# Patient Record
Sex: Female | Born: 1970 | Race: White | Hispanic: No | Marital: Married | Smoking: Never smoker
Health system: Southern US, Community
[De-identification: ages and names within clinical notes are randomized; demographics above are authoritative.]

## PROBLEM LIST (undated history)

## (undated) DIAGNOSIS — F419 Anxiety disorder, unspecified: Secondary | ICD-10-CM

## (undated) DIAGNOSIS — Z9289 Personal history of other medical treatment: Secondary | ICD-10-CM

## (undated) DIAGNOSIS — I493 Ventricular premature depolarization: Secondary | ICD-10-CM

## (undated) HISTORY — PX: DILATION AND EVACUATION: SHX1459

## (undated) HISTORY — DX: Anxiety disorder, unspecified: F41.9

## (undated) HISTORY — DX: Personal history of other medical treatment: Z92.89

## (undated) HISTORY — DX: Ventricular premature depolarization: I49.3

---

## 2003-02-06 ENCOUNTER — Encounter: Payer: Self-pay | Admitting: Obstetrics and Gynecology

## 2003-02-06 ENCOUNTER — Ambulatory Visit (HOSPITAL_COMMUNITY): Admission: RE | Admit: 2003-02-06 | Discharge: 2003-02-06 | Payer: Self-pay | Admitting: Obstetrics and Gynecology

## 2003-02-27 ENCOUNTER — Encounter: Payer: Self-pay | Admitting: Obstetrics and Gynecology

## 2003-02-27 ENCOUNTER — Ambulatory Visit (HOSPITAL_COMMUNITY): Admission: RE | Admit: 2003-02-27 | Discharge: 2003-02-27 | Payer: Self-pay | Admitting: Obstetrics and Gynecology

## 2003-06-03 ENCOUNTER — Inpatient Hospital Stay (HOSPITAL_COMMUNITY): Admission: AD | Admit: 2003-06-03 | Discharge: 2003-06-03 | Payer: Self-pay | Admitting: Obstetrics and Gynecology

## 2003-07-04 ENCOUNTER — Inpatient Hospital Stay (HOSPITAL_COMMUNITY): Admission: AD | Admit: 2003-07-04 | Discharge: 2003-07-08 | Payer: Self-pay | Admitting: Internal Medicine

## 2003-08-13 ENCOUNTER — Other Ambulatory Visit: Admission: RE | Admit: 2003-08-13 | Discharge: 2003-08-13 | Payer: Self-pay | Admitting: Obstetrics and Gynecology

## 2003-09-22 ENCOUNTER — Encounter: Admission: RE | Admit: 2003-09-22 | Discharge: 2003-10-22 | Payer: Self-pay | Admitting: Obstetrics and Gynecology

## 2004-04-19 ENCOUNTER — Encounter: Admission: RE | Admit: 2004-04-19 | Discharge: 2004-04-19 | Payer: Self-pay | Admitting: Obstetrics and Gynecology

## 2005-10-20 ENCOUNTER — Encounter (INDEPENDENT_AMBULATORY_CARE_PROVIDER_SITE_OTHER): Payer: Self-pay | Admitting: Specialist

## 2005-10-20 ENCOUNTER — Ambulatory Visit (HOSPITAL_COMMUNITY): Admission: RE | Admit: 2005-10-20 | Discharge: 2005-10-20 | Payer: Self-pay | Admitting: Obstetrics and Gynecology

## 2006-06-07 ENCOUNTER — Inpatient Hospital Stay (HOSPITAL_COMMUNITY): Admission: AD | Admit: 2006-06-07 | Discharge: 2006-06-07 | Payer: Self-pay | Admitting: Obstetrics and Gynecology

## 2006-06-19 ENCOUNTER — Ambulatory Visit (HOSPITAL_COMMUNITY): Admission: RE | Admit: 2006-06-19 | Discharge: 2006-06-19 | Payer: Self-pay | Admitting: Obstetrics and Gynecology

## 2006-07-10 ENCOUNTER — Ambulatory Visit (HOSPITAL_COMMUNITY): Admission: RE | Admit: 2006-07-10 | Discharge: 2006-07-10 | Payer: Self-pay | Admitting: Obstetrics and Gynecology

## 2006-08-09 ENCOUNTER — Ambulatory Visit (HOSPITAL_COMMUNITY): Admission: RE | Admit: 2006-08-09 | Discharge: 2006-08-09 | Payer: Self-pay | Admitting: Obstetrics and Gynecology

## 2006-11-06 ENCOUNTER — Inpatient Hospital Stay (HOSPITAL_COMMUNITY): Admission: AD | Admit: 2006-11-06 | Discharge: 2006-11-06 | Payer: Self-pay | Admitting: Obstetrics and Gynecology

## 2006-12-02 ENCOUNTER — Inpatient Hospital Stay (HOSPITAL_COMMUNITY): Admission: AD | Admit: 2006-12-02 | Discharge: 2006-12-05 | Payer: Self-pay | Admitting: Obstetrics and Gynecology

## 2008-07-03 IMAGING — US US OB DETAIL+14 WK
1 series · 14 of 28 positions shown · non-contrast
Comparison: none

OBSTETRICAL ULTRASOUND:
 This ultrasound was performed in The [HOSPITAL], and the [facility] report will be stored to [REDACTED] PACS.

[Series 1: us ob detail+14 wk · 14 of 87 slices shown]
[im 4/87]
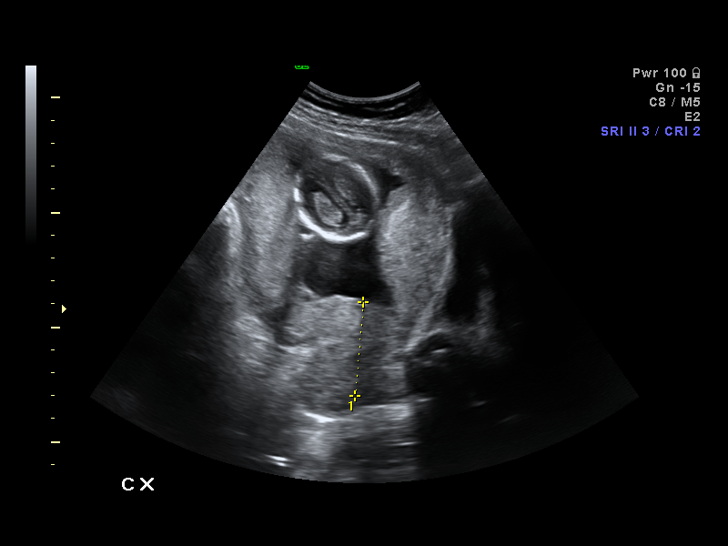
[im 10/87]
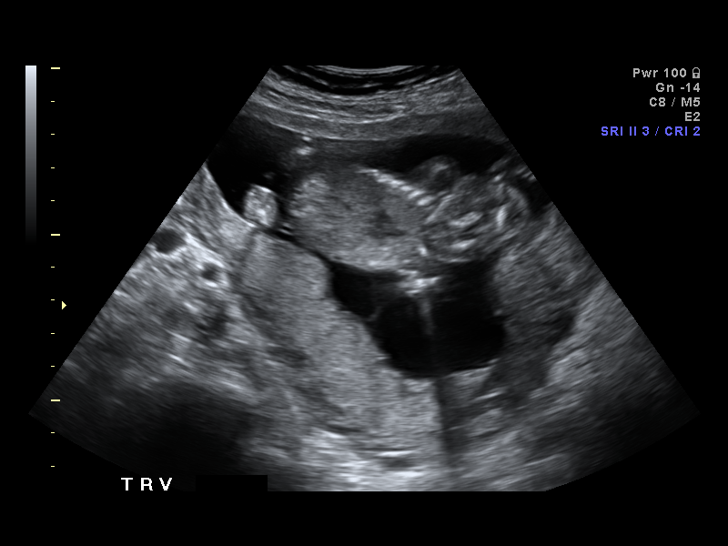
[im 16/87]
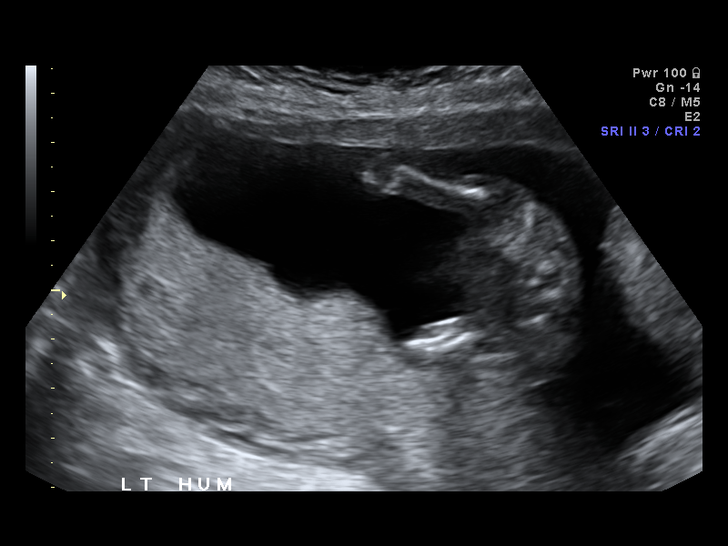
[im 23/87]
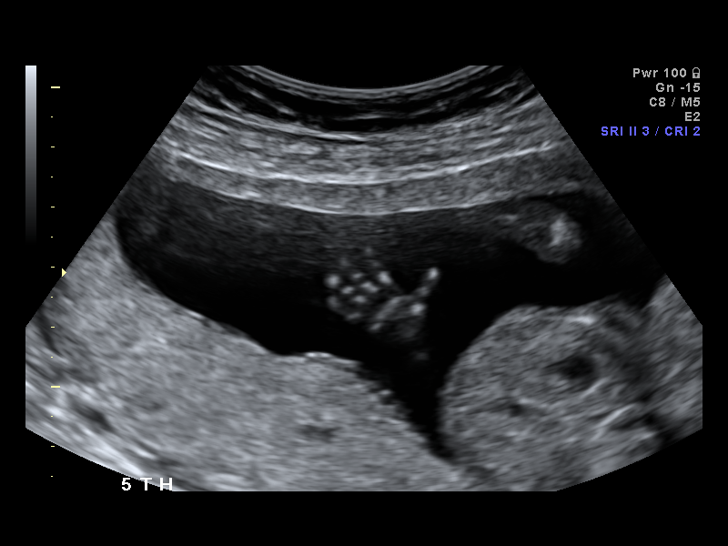
[im 29/87]
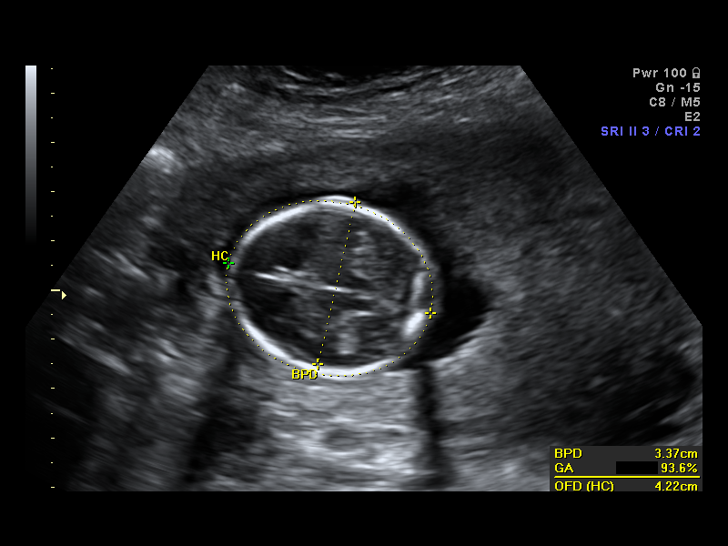
[im 36/87]
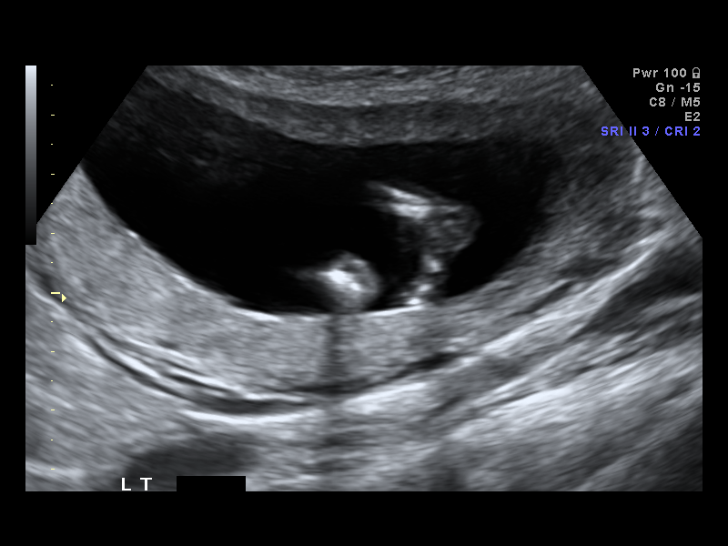
[im 42/87]
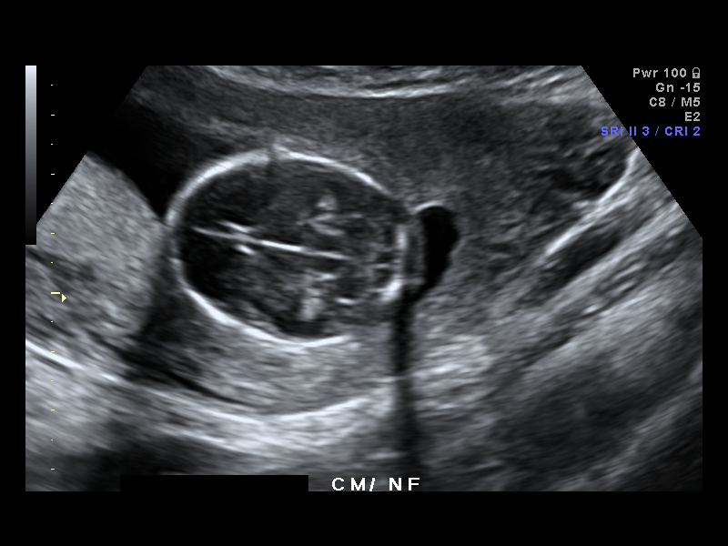
[im 48/87]
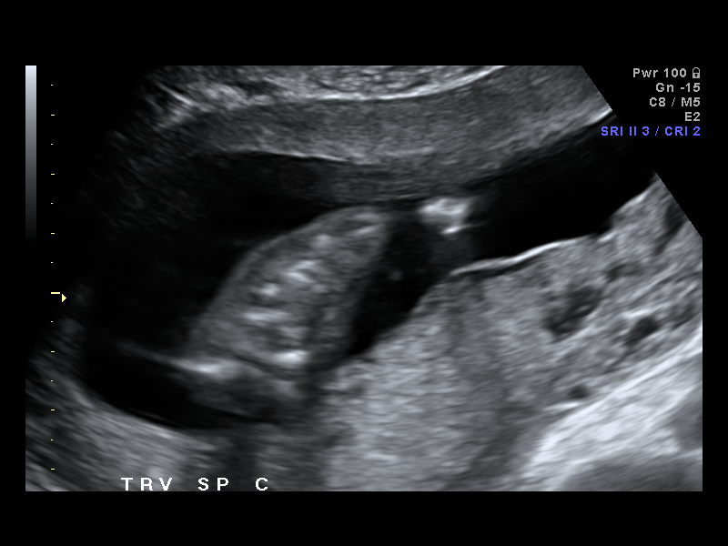
[im 55/87]
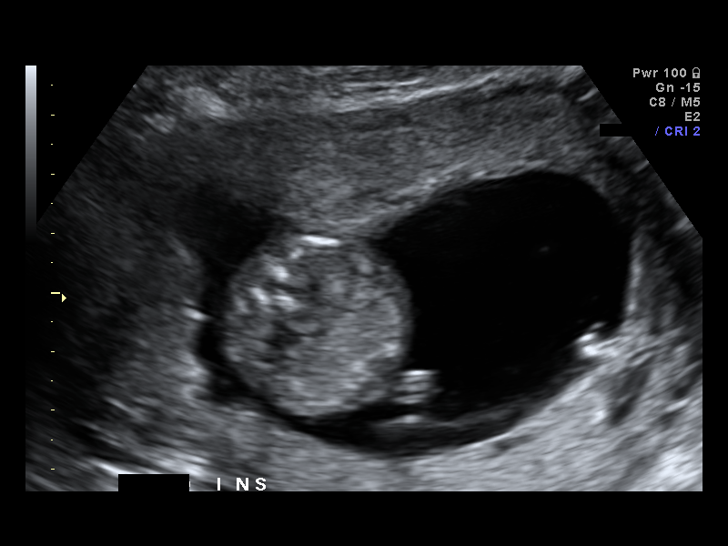
[im 61/87]
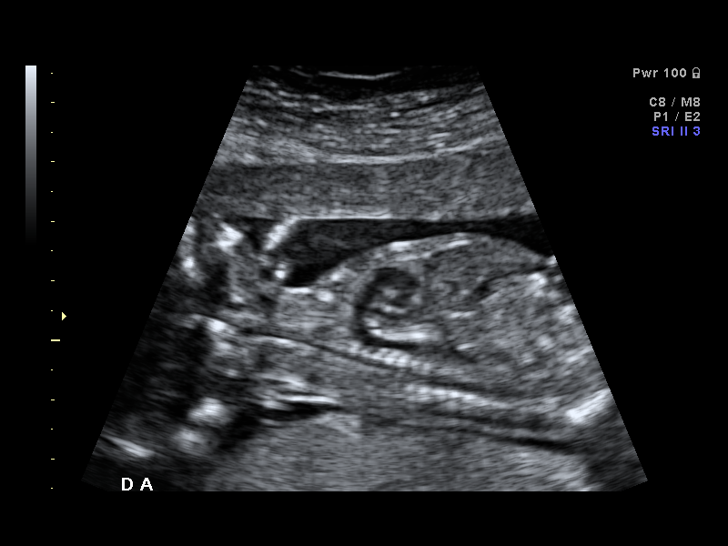
[im 67/87]
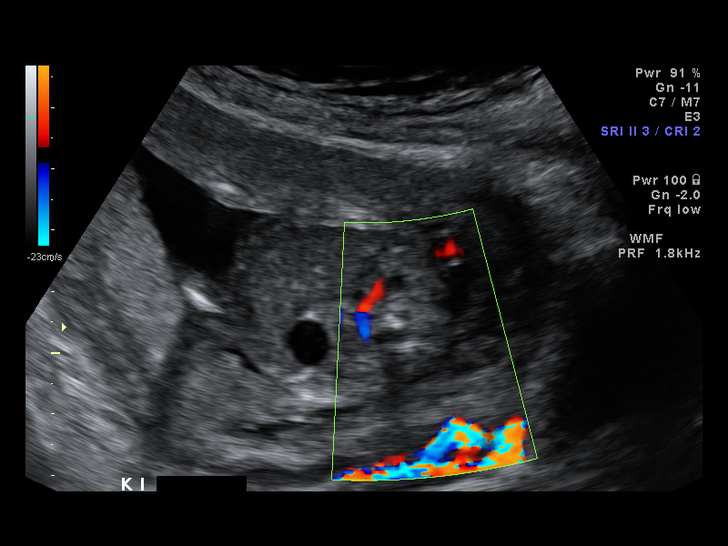
[im 74/87]
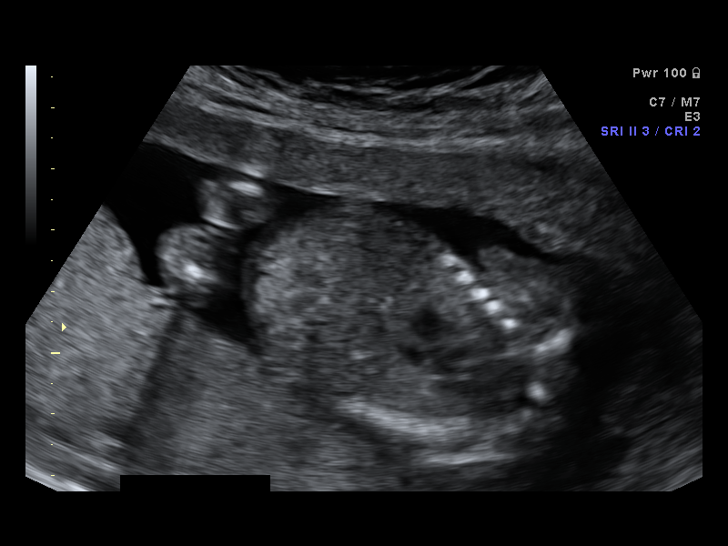
[im 80/87]
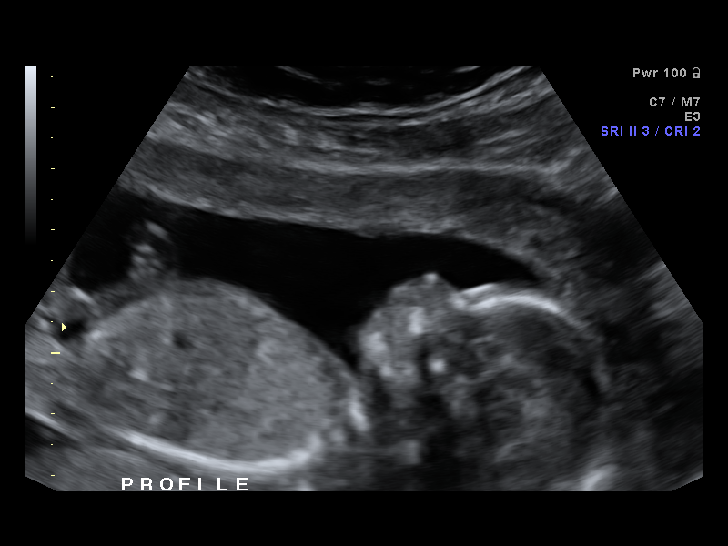
[im 87/87]
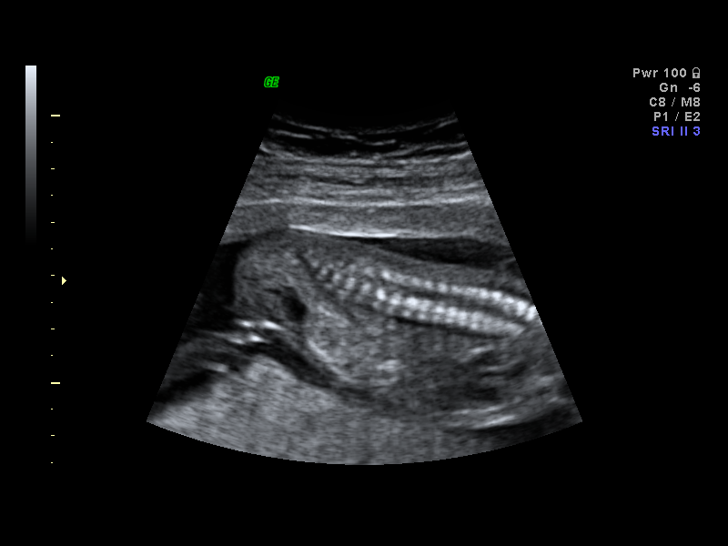

[14 of 28 positions shown; findings below may reference images not displayed]

IMPRESSION: The [facility] report has also been faxed to the ordering physician.

## 2008-10-13 ENCOUNTER — Encounter: Admission: RE | Admit: 2008-10-13 | Discharge: 2008-10-13 | Payer: Self-pay | Admitting: Sports Medicine

## 2010-11-15 NOTE — Op Note (Signed)
NAMEWOODROW, DULSKI           ACCOUNT NO.:  [id number]   MEDICAL RECORD NO.:  [id number]          PATIENT TYPE:  INP   LOCATION:  9198                          FACILITY:  [facility]   PHYSICIAN:  Duke Salvia. Marcelle Overlie, M.D.DATE OF BIRTH:  11/09/1970   DATE OF PROCEDURE:  12/02/2006  DATE OF DISCHARGE:                               OPERATIVE REPORT   PREOPERATIVE DIAGNOSES:  1. Term intrauterine pregnancy in labor.  2. Previous cesarean section x2, declines vaginal birth after cesarean      section.   POSTOPERATIVE DIAGNOSES:  1. Term intrauterine pregnancy in labor.  2. Previous cesarean section x2, declines vaginal birth after cesarean      section.   PROCEDURE:  Repeat low transverse cesarean section.   SURGEON:  Duke Salvia. Marcelle Overlie, M.D.   ANESTHESIA:  Spinal.   COMPLICATIONS:  None.   DRAINS:  Foley catheter.   BLOOD LOSS:  800 mL.   PROCEDURE AND FINDINGS:  The patient was taken to the operating room.  After an adequate level of spinal anesthetic was obtained with the  patient in left tilt position, the abdomen prepped and draped in the  usual manner for sterile abdominal procedure.  Foley catheter positioned  draining clear urine.  Transverse incision was made excising the old  scar.  This was carried down to the fascia which was incised and  extended transversely.  Rectus muscle divided in the midline.  Peritoneum entered superiorly without incident and extended vertically.  The vesicouterine serosa was then incised and the bladder was sharply  and bluntly dissected below.  Bladder blade repositioned.  Transverse  incision made in the lower segment which was quite thin, this was  extended with blunt dissection.  Clear fluid noted.  The patient  delivered of an 9 pound 3 ounce female. Apgars 9 and 9.  The infant was  suctioned, cord clamped and passed to pediatric team for further care.  The placenta was then delivered manually intact.  Uterus exteriorized,  cavity wiped  clean with laparotomy pack.  Closure obtained with first  layer of 0 chromic in a locked fashion followed by an imbricating layer  with chromic.  The bladder flap area was intact and hemostatic.  Tubes  and ovaries were normal.  Prior to closure sponge, needle and instrument  counts were reported as correct x2.  Peritoneum closed with running 2-0  Vicryl suture.  A 2-0 Vicryl interrupted was used to approximate the  rectus muscles in the midline, 0 PDS was then used to close the fascia  from laterally to midline on either side.  Subcutaneous tissue was hemostatic.  Clips and Steri-Strips were used on  the skin.  Pressure dressing applied.  Clear urine noted at the end of  the case.  She did receive Ancef 1 gram IV after the cord was clamped  along with Pitocin IV.  She tolerated this well.  Mother and baby doing  well at that point.      Richard M. Marcelle Overlie, M.D.  Electronically Signed     RMH/MEDQ  D:  12/02/2006  T:  12/02/2006  Job:  [id number]

## 2010-11-18 NOTE — Op Note (Signed)
NAME:  [name], [name]                     ACCOUNT NO.:  [id number]   MEDICAL RECORD NO.:  [id number]                   PATIENT TYPE:  INP   LOCATION:  9146                                 FACILITY:  [facility]   PHYSICIAN:  Zenaida Niece, M.D.             DATE OF BIRTH:  01/21/1971   DATE OF PROCEDURE:  07/04/2003  DATE OF DISCHARGE:                                 OPERATIVE REPORT   PREOPERATIVE DIAGNOSES:  1. Intrauterine pregnancy at 39 weeks.  2. Meconium stained amniotic fluid.  3. Arrest of dilation.   POSTOPERATIVE DIAGNOSES:  1. Intrauterine pregnancy at 39 weeks.  2. Meconium stained amniotic fluid.  3. Arrest of dilation.   PROCEDURE:  Primary low transverse cesarean section without extensions.   SURGEON:  Zenaida Niece, M.D.   ANESTHESIA:  Epidural.   ESTIMATED BLOOD LOSS:  1000 mL.   FINDINGS:  The patient had normal anatomy and delivered a viable female infant  with Apgars of 9 and 9 that weighed 9 pounds, 1 ounce.   PROCEDURE IN DETAIL:  The patient was taken to the operating room and placed  in the dorsal supine position.  Previously placed epidural was dosed  appropriately and abdomen was prepped and draped in the usual sterile  fashion.  The level of anesthesia was found to be adequate, and abdomen was  entered via a standard Pfannenstiel incision.  Bladder blade was placed, and  a transverse incision was made in the lower uterine segment and the bladder  pushed inferior.  The uterine incision was extended bilaterally digitally.  The fetal vertex was grasped and delivered through the incision  atraumatically.  Suction was performed with DeLee suction as well as bulb,  and a small amount of lightly meconium stained fluid was aspirated.  The  remainder of the infant delivered atraumatically.  The baby cried while it  was still delivered only to the head.  The cord was doubly clamped and cut  and the infant handed to the awaiting pediatric team.  Cord  blood and  segment of cord for gas were obtained and the placenta delivered  spontaneously.  Uterus was wiped dry with a clean lap pad, and mild uterine  atony controlled with IV Pitocin and massage.  The uterine incision was  inspected and found to be free of extensions.  Incision was closed in one  layer, being a running locking layer with #1 chromic with adequate  hemostasis.  Both tubes and ovaries were inspected and found to be normal.  The uterine incision was again inspected and found to be hemostatic.  Subfascial space was irrigated and made hemostatic with electrocautery.  The  fascia was closed in a running fashion, starting at both ends and meeting in  the middle with 0 Vicryl.  The subcutaneous tissue was irrigated and made  hemostatic with  electrocautery.  Skin was closed with running subcuticular suture of 4-0  Prolene followed by  Steri-Strips and a pressure bandage.  The patient  tolerated the procedure well and was taken to the recovery room in stable  condition.  Counts were correct x 2, and she was given Ancef 1 g after cord  clamped.                                               Zenaida Niece, M.D.    TDM/MEDQ  D:  07/04/2003  T:  07/05/2003  Job:  [id number]

## 2010-11-18 NOTE — Op Note (Signed)
NAMEMarland Martin  [name], [name] NO.:  [id number]   MEDICAL RECORD NO.:  [id number]          PATIENT TYPE:  AMB   LOCATION:  SDC                           FACILITY:  [facility]   PHYSICIAN:  Juluis Mire, M.D.   DATE OF BIRTH:  January 27, 1971   DATE OF PROCEDURE:  10/20/2005  DATE OF DISCHARGE:  10/20/2005                                 OPERATIVE REPORT   PREOPERATIVE DIAGNOSIS:  Nonviable first trimester pregnancy.   POSTOPERATIVE DIAGNOSIS:  Nonviable first trimester pregnancy.   OPERATIVE PROCEDURE:  Dilatation and evacuation.   SURGEON:  Juluis Mire, M.D.   ANESTHESIA:  Paracervical block and sedation.   ESTIMATED BLOOD LOSS:  Minimal.   PACK AND DRAINS:  None.   INTRAOPERATIVE BLOOD REPLACEMENT:  None.   COMPLICATIONS:  None.   INDICATION:  Indications are dictated in the history and physical.   DESCRIPTION OF PROCEDURE:  The patient was taken to the OR and placed in a  supine position.  After sedation, she was placed in the dorsal lithotomy  position using the Allen stirrups.  The patient was draped into a sterile  field.  A speculum was placed in the vaginal vault and cervix and vagina  cleansed with Betadine.  Paracervical block was instituted using 1%  Nesacaine.  Cervix was secured with a single-tooth tenaculum.  Uterus  sounded to approximately 12 cm.  Cervix was serially dilated to a size 27  Pratt dilator.  A size 8 suction curette was introduced and the intrauterine  cavity was evacuated using suction curetting.  We continued this until no  additional tissue was obtained.  We then sharply curetted the intrauterine  cavity and we felt like all quadrants were clear and the uterus was  contracting down well.  Repeat suction curettage revealed no additional  tissue.  We had minimal bleeding and no signs of complications.  The single-  tooth tenaculum and speculum were then removed.  The patient was taken out  of the dorsal lithotomy position and once alert,  transferred to recovery  room in good condition.  Sponge, needle and instrument count was reported as  correct by circulating nurse x2.      Juluis Mire, M.D.  Electronically Signed    JSM/MEDQ  D:  10/20/2005  T:  10/23/2005  Job:  [id number]

## 2010-11-18 NOTE — Discharge Summary (Signed)
[name], [name]                     ACCOUNT NO.:  [id number]   MEDICAL RECORD NO.:  [id number]                   PATIENT TYPE:  INP   LOCATION:  9146                                 [facility]   PHYSICIAN:  Zenaida Niece, M.D.             DATE OF BIRTH:  July 06, 1970   DATE OF ADMISSION:  07/04/2003  DATE OF DISCHARGE:  07/08/2003                                 DISCHARGE SUMMARY   ADMISSION DIAGNOSES:  1. Intrauterine pregnancy at 39 weeks.  2. Meconium stained amniotic fluid.   DISCHARGE DIAGNOSES:  1. Intrauterine pregnancy at 39 weeks.  2. Meconium stained amniotic fluid.  3. Arrest of dilation.   PROCEDURE:  On January 1 she had a primary low transverse cesarean section.   HISTORY AND PHYSICAL:  This is a 40 year old white female gravida 1, para 0  with an EGA of [redacted] weeks who presented with the complaint of ruptured  membranes and regular contractions.  Prenatal care uncomplicated for a  fundal height slightly ahead of dates.   PRENATAL LABORATORIES:  Blood type is A+ with a negative antibody screen.  RPR nonreactive.  Rubella immune.  Hepatitis B surface antigen negative.  HIV negative.  Gonorrhea and Chlamydia negative.  Triple screen normal.  One-  hour Glucola 106.  Group B Strep is negative.   PAST MEDICAL HISTORY:  Noncontributory.   PHYSICAL EXAMINATION:  VITAL SIGNS:  She is afebrile with stable vital  signs.  ABDOMEN:  Soft.  Fundus is nontender.  Fetal heart tracing is normal with  contractions every two to three minutes.  PELVIC:  Cervix on Dr. Ebony Martin first examination is 4, 90, -1, and a vertex  presentation and there was slightly meconium stained amniotic fluid.   HOSPITAL COURSE:  The patient was admitted after she presented with  contractions and leaking fluid and was confirmed to have ruptured membranes  with slightly stained meconium fluid.  She continued to progress and  received an epidural.  She then had a protracted course and  was started on  Pitocin.  With Pitocin she continued to have a protracted course and was not  able to make it past 8-9 cm.  This was after several hours of Pitocin with  fairly adequate contractions by an IUPC.  On the evening of January 1 she  underwent a primary low transverse cesarean section for arrest of dilation.  This was done epidural anesthesia with an estimated blood loss of 1000 mL.  The patient had normal anatomy and delivered a viable female infant with  Apgars of 9 and 9 that weighed 9 pounds 1 ounce.  Postoperatively she had no  complications.  Pre delivery hemoglobin 12.9, post delivery 10.5.  She did  stay a full four days postoperatively to work on breast-feeding.  On  postoperative day #4 she was stable for discharge home.  She had a Prolene  subcuticular suture which was removed on the evening of  postoperative day #3  and Steri-Strips were in place.   DISCHARGE INSTRUCTIONS:  Regular diet.  Pelvic rest.  No strenuous activity.  Follow-up is in two weeks for an incision check.  Medications are Percocet  p.r.n. pain and over-the-counter ibuprofen p.r.n. pain.  She is given our  discharge pamphlet.                                               Zenaida Niece, M.D.    TDM/MEDQ  D:  07/08/2003  T:  07/08/2003  Job:  [id number]

## 2010-11-18 NOTE — H&P (Signed)
NAME:  [name], [name] NO.:  [id number]   MEDICAL RECORD NO.:  [id number]          PATIENT TYPE:  AMB   LOCATION:  SDC                           [facility]ILITY:  [facility]   PHYSICIAN:  Juluis Mire, M.D.   DATE OF BIRTH:  1970-12-26   DATE OF ADMISSION:  10/20/2005  DATE OF DISCHARGE:                                HISTORY & PHYSICAL   HISTORY OF PRESENT ILLNESS:  The patient is a 40 year old gravida 2, para 1,  married white female, last menstrual period of August 12, 2005, giving her  an estimated date of confinement of May 20, 2006 and gives her an  estimated gestational age of approximately 10 weeks.  She was having  bleeding and came into the office for an ultrasound.  Ultrasound revealed a  6-week 3-day-size gestational sac.  Fetal pole was noted with no fetal heart  activity, consistent with a nonviable first trimester of pregnancy.  The  patient now presents for dilatation and evacuation.   ALLERGIES:  No known drug allergies.   MEDICATIONS:  Vitamins.   PAST MEDICAL HISTORY:  Usual childhood diseases with no significant  sequelae.   PAST SURGICAL HISTORY:  She had a prior cesarean section.   FAMILY HISTORY:  Noncontributory.   SOCIAL HISTORY:  No tobacco or alcohol use.   REVIEW OF SYSTEMS:  Noncontributory.   PHYSICAL EXAMINATION:  VITAL SIGNS:  The patient is afebrile with stable  vital signs.  HEENT:  The patient is normocephalic.  Pupils are equal, round and reactive  to light and accommodation.  Extraocular movements were intact.  Sclerae and  conjunctivae were clear.  Oropharynx clear.  NECK:  Without thyromegaly.  BREASTS:  No discrete masses.  LUNGS:  Clear.  CARDIOVASCULAR:  Regular rate and rhythm without murmurs or gallops.  ABDOMEN:  Benign.  No mass, organomegaly or tenderness.  PELVIC:  Uterus 8-9 weeks in size.  Adnexa unremarkable.  EXTREMITIES:  Trace edema.  NEUROLOGIC:  Grossly within normal limits.   IMPRESSION:  Nonviable  first trimester of pregnancy.   PLAN:  The patient will undergo dilatation and evacuation.  Blood type will  be determined to see if RhoGAM is needed.  Risks of the procedure were  discussed including the risk of infection, the risk of hemorrhage that could  require transfusion with the risks of AIDS or hepatitis, the risk of injury  to adjacent organs including bladder, bowel or ureters which could require  further exploratory surgery, risks of deep venous thrombosis and pulmonary  embolus.  The patient expressed understanding of indications and risks.      Juluis Mire, M.D.  Electronically Signed     JSM/MEDQ  D:  10/20/2005  T:  10/20/2005  Job:  [id number]

## 2010-11-18 NOTE — Discharge Summary (Signed)
[name], [name]           ACCOUNT NO.:  [id number]   MEDICAL RECORD NO.:  [id number]          PATIENT TYPE:  INP   LOCATION:  9142                          [facility]ACILITY:  [facility]   PHYSICIAN:  Dineen Kid. Rana Snare, M.D.    DATE OF BIRTH:  03-21-1971   DATE OF ADMISSION:  12/02/2006  DATE OF DISCHARGE:  12/05/2006                               DISCHARGE SUMMARY   ADMITTING DIAGNOSES:  1. Intrauterine pregnancy at term.  2. Previous cesarean section desires repeat cesarean delivery.  3. Spontaneous onset of labor.   DISCHARGE DIAGNOSES:  1. Status post low transverse cesarean section.  2. Viable female infant.   PROCEDURE:  Repeat low transverse cesarean section.   REASON FOR ADMISSION:  Please see written H&P.   HOSPITAL COURSE:  The patient is a 40 year old gravida 3, para 1-0-1-1  that was admitted to [facility] with spontaneous onset  of labor.  The patient had had two previous cesarean deliveries and  desired a repeat cesarean section.  On admission, vital signs were  stable, blood pressure 120/78.  Cervix was dilated to 7 cm, completely  effaced, vertex at a -2 station.  Fetal heart tones were reactive with a  rate of 142 beats per minute.  The patient was offered a vaginal birth  after cesarean; however, she declined.  The patient was then transferred  to the operating room where spinal anesthesia was administered without  difficulty.  A low transverse incision was made with the delivery of a  viable female infant weighing 9 pounds 3 ounces with Apgars of 9 at one  minute and 9 at five minutes.  The patient tolerated the procedure well  and was taken to the recovery room in stable condition.  On  postoperative day #1 the patient was without complaint.  Vital signs  were stable.  She was afebrile.  Abdomen was soft with good return of  bowel function.  Fundus was firm and nontender.  Abdominal dressing was  noted to be clean, dry and intact.  Laboratory findings  showed  hemoglobin of 10.1; platelet count of 259,000; wbc count of 13.3.  On  postoperative day #2 the patient did have some small oozing that was  noted from the midpoint of the incision.  Vital signs were stable.  She  was afebrile.  Abdomen was soft, fundus firm and nontender.  She was  ambulating well, tolerating a regular diet without complaints of nausea  and vomiting.  On postoperative day #3 the patient was without  complaint.  Vital signs were stable.  Abdomen was soft, fundus firm and  nontender.  Incision was clean, dry and intact.  Staples were removed  and the patient was later discharged home.   CONDITION ON DISCHARGE:  Good.   DIET:  Regular as tolerated.   ACTIVITY:  No heavy lifting, no driving x2 weeks, no vaginal entry.   FOLLOWUP:  The patient is to follow up in the office in 1-2 weeks for an  incision check.  She is to call for temperature greater than 100  degrees, persistent nausea and vomiting, heavy vaginal bleeding,  and/or  redness or drainage from the incisional site.   DISCHARGE MEDICATIONS:  1. Percocet 5/325 #30 one p.o. every 4-6 hours p.r.n.  2. Motrin 600 mg every 6 hours.  3. Prenatal vitamins one p.o. daily.  4. Colace one p.o. daily p.r.n.      Julio Sicks, N.P.      Dineen Kid Rana Snare, M.D.  Electronically Signed    CC/MEDQ  D:  01/07/2007  T:  01/07/2007  Job:  [id number]

## 2011-03-17 ENCOUNTER — Other Ambulatory Visit: Payer: Self-pay | Admitting: Gastroenterology

## 2011-04-20 LAB — TYPE AND SCREEN
ABO/RH(D): A POS
Antibody Identification: NOT DETECTED
Antibody Screen: POSITIVE
DAT, IgG: NEGATIVE

## 2011-04-20 LAB — RPR: RPR Ser Ql: NONREACTIVE

## 2011-04-20 LAB — CBC
HCT: 29.9 — ABNORMAL LOW
HCT: 37.5
Hemoglobin: 10.1 — ABNORMAL LOW
Hemoglobin: 12.5
MCHC: 33.4
MCHC: 33.9
MCV: 89.1
MCV: 89.5
Platelets: 259
Platelets: 291
RBC: 3.34 — ABNORMAL LOW
RBC: 4.21
RDW: 13.9
RDW: 14.2 — ABNORMAL HIGH
WBC: 13.3 — ABNORMAL HIGH
WBC: 15.1 — ABNORMAL HIGH

## 2012-03-22 ENCOUNTER — Encounter: Payer: Self-pay | Admitting: *Deleted

## 2012-03-25 ENCOUNTER — Ambulatory Visit (INDEPENDENT_AMBULATORY_CARE_PROVIDER_SITE_OTHER): Payer: 59 | Admitting: Cardiology

## 2012-03-25 ENCOUNTER — Encounter: Payer: Self-pay | Admitting: Cardiology

## 2012-03-25 VITALS — BP 120/78 | HR 71 | Ht 65.0 in | Wt 159.0 lb

## 2012-03-25 DIAGNOSIS — I4949 Other premature depolarization: Secondary | ICD-10-CM

## 2012-03-25 DIAGNOSIS — I493 Ventricular premature depolarization: Secondary | ICD-10-CM

## 2012-03-25 NOTE — Patient Instructions (Addendum)
Your physician recommends that you schedule a follow-up appointment in: We will call you with results  Your physician has requested that you have an echocardiogram. Echocardiography is a painless test that uses sound waves to create images of your heart. It provides your doctor with information about the size and shape of your heart and how well your heart's chambers and valves are working. This procedure takes approximately one hour. There are no restrictions for this procedure.

## 2012-03-25 NOTE — Progress Notes (Signed)
   Clinical Summary [name] is a 41 y.o.female referred for cardiology consultation by Dr. Richardean Martin. She reports a history of palpitations and rapid heart rate, occurs intermittently on a daily basis. No associated syncope. She was diagnosed with symptomatic PVCs approximately 3-4 years ago, was seen by a cardiologist in [place] (details not clear), and underwent reported stress test and echocardiogram. She was not treated with cardiac medications at that time. She does indicate that she was treated ultimately for anxiety and panic attacks, placed on Celexa, and the PVCs resolved completely. She states that she took herself off Celexa months ago and has been experiencing PVCs since then.  ECG shows normal sinus rhythm, vertical axis. Occasional PVCs noted on associated rhythm strip.  She reports no pulmonary symptoms, HTN, thyroid disease, tobacco use, or significant stimulant use. Indicates stable amount of stress in her life recently.   No Known Allergies  Current Outpatient Prescriptions  Medication Sig Dispense Refill  . desogestrel-ethinyl estradiol (APRI,EMOQUETTE,SOLIA) 0.15-30 MG-MCG tablet Take 1 tablet by mouth daily.        Past Medical History  Diagnosis Date  . PVC's (premature ventricular contractions)   . Anxiety     Possible panic attacks - was on Celexa for three years  . History of cardiovascular stress test     Three to four years ago    Past Surgical History  Procedure Date  . Cesarean section     X 2  . Dilation and evacuation     Family History  Problem Relation Age of Onset  . Cancer Maternal Aunt   . Diabetes Paternal Uncle   . Stroke Paternal Aunt     Social History [name] reports that she has never smoked. She does not have any smokeless tobacco history on file. [name] reports that she does not drink alcohol.  Review of Systems Trace edema in ankles at the end of the day. Otherwise negative except as outlined.  Physical  Examination Filed Vitals:   03/25/12 0838  BP: 120/78  Pulse: 71   Filed Weights   03/25/12 0838  Weight: 159 lb (72.122 kg)   Patient in no acute distress. HEENT: Conjunctiva and lids normal, oropharynx clear with moist mucosa. Neck: Supple, no elevated JVP or carotid bruits, no thyromegaly. Lungs: Clear to auscultation, nonlabored breathing at rest. Cardiac: Regular rate and rhythm with occasional ectopic beats, no S3 or significant systolic murmur, no pericardial rub. Abdomen: Soft, nontender, bowel sounds present, no guarding or rebound. Extremities: No pitting edema, distal pulses 2+. Skin: Warm and dry. Musculoskeletal: No kyphosis. Neuropsychiatric: Alert and oriented x3, affect grossly appropriate.   Problem List and Plan   Symptomatic PVCs Previous history based on report. Do not have details of previous cardiac workup, but she states that she was told the testing looked good. Baseline ECG reviewed today. Suspect that her PVCs are benign, without syncope or exercise intolerance, and if LV function remains normal. Followup echocardiogram will be obtained. We discussed basics of exercise, stress reduction, also avoiding stimulant use. I also asked her to followup with her primary care provider to discuss whether she needs to reconsider Celexa or other management for anxiety/panic attacks, particularly since she states that her PVCs completely resolved on prior treatment. At this time would not recommend beta blockers or anitarrhythmic treatment of her PVCs. Will inform her of the echocardiogram results.    [name], M.D., F.A.C.C.

## 2012-03-25 NOTE — Assessment & Plan Note (Signed)
Previous history based on report. Do not have details of previous cardiac workup, but she states that she was told the testing looked good. Baseline ECG reviewed today. Suspect that her PVCs are benign, without syncope or exercise intolerance, and if LV function remains normal. Followup echocardiogram will be obtained. We discussed basics of exercise, stress reduction, also avoiding stimulant use. I also asked her to followup with her primary care provider to discuss whether she needs to reconsider Celexa or other management for anxiety/panic attacks, particularly since she states that her PVCs completely resolved on prior treatment. At this time would not recommend beta blockers or anitarrhythmic treatment of her PVCs. Will inform her of the echocardiogram results.

## 2012-03-26 ENCOUNTER — Ambulatory Visit (HOSPITAL_COMMUNITY)
Admission: RE | Admit: 2012-03-26 | Discharge: 2012-03-26 | Disposition: A | Discharge status: Home / Self Care | Payer: 59 | Source: Ambulatory Visit | Attending: Cardiology | Admitting: Cardiology

## 2012-03-26 DIAGNOSIS — I493 Ventricular premature depolarization: Secondary | ICD-10-CM

## 2012-03-26 DIAGNOSIS — R9431 Abnormal electrocardiogram [ECG] [EKG]: Secondary | ICD-10-CM

## 2012-03-26 DIAGNOSIS — I4949 Other premature depolarization: Secondary | ICD-10-CM | POA: Insufficient documentation

## 2012-03-26 NOTE — Progress Notes (Signed)
*  PRELIMINARY RESULTS* Echocardiogram 2D Echocardiogram has been performed.  [name] 03/26/2012, 8:54 AM

## 2014-02-26 ENCOUNTER — Other Ambulatory Visit: Payer: Self-pay | Admitting: Obstetrics and Gynecology

## 2014-02-27 LAB — CYTOLOGY - PAP

## 2014-10-17 ENCOUNTER — Encounter (HOSPITAL_COMMUNITY): Payer: Self-pay | Admitting: Emergency Medicine

## 2014-10-17 ENCOUNTER — Emergency Department (HOSPITAL_COMMUNITY)
Admission: EM | Admit: 2014-10-17 | Discharge: 2014-10-17 | Disposition: A | Discharge status: Home / Self Care | Payer: 59 | Attending: Emergency Medicine | Admitting: Emergency Medicine

## 2014-10-17 ENCOUNTER — Emergency Department (HOSPITAL_COMMUNITY): Payer: 59

## 2014-10-17 DIAGNOSIS — G4489 Other headache syndrome: Secondary | ICD-10-CM | POA: Insufficient documentation

## 2014-10-17 DIAGNOSIS — R51 Headache: Secondary | ICD-10-CM | POA: Diagnosis present

## 2014-10-17 DIAGNOSIS — Z79899 Other long term (current) drug therapy: Secondary | ICD-10-CM | POA: Insufficient documentation

## 2014-10-17 DIAGNOSIS — Z8679 Personal history of other diseases of the circulatory system: Secondary | ICD-10-CM | POA: Insufficient documentation

## 2014-10-17 DIAGNOSIS — F419 Anxiety disorder, unspecified: Secondary | ICD-10-CM | POA: Insufficient documentation

## 2014-10-17 LAB — CBC WITH DIFFERENTIAL/PLATELET
Basophils Absolute: 0 10*3/uL (ref 0.0–0.1)
Basophils Relative: 0 % (ref 0–1)
Eosinophils Absolute: 0 10*3/uL (ref 0.0–0.7)
Eosinophils Relative: 0 % (ref 0–5)
HCT: 40.7 % (ref 36.0–46.0)
Hemoglobin: 13.9 g/dL (ref 12.0–15.0)
Lymphocytes Relative: 13 % (ref 12–46)
Lymphs Abs: 1.4 10*3/uL (ref 0.7–4.0)
MCH: 30.1 pg (ref 26.0–34.0)
MCHC: 34.2 g/dL (ref 30.0–36.0)
MCV: 88.1 fL (ref 78.0–100.0)
Monocytes Absolute: 0.3 10*3/uL (ref 0.1–1.0)
Monocytes Relative: 3 % (ref 3–12)
Neutro Abs: 8.8 10*3/uL — ABNORMAL HIGH (ref 1.7–7.7)
Neutrophils Relative %: 84 % — ABNORMAL HIGH (ref 43–77)
Platelets: 293 10*3/uL (ref 150–400)
RBC: 4.62 MIL/uL (ref 3.87–5.11)
RDW: 13.3 % (ref 11.5–15.5)
WBC: 10.5 10*3/uL (ref 4.0–10.5)

## 2014-10-17 LAB — BASIC METABOLIC PANEL
Anion gap: 10 (ref 5–15)
BUN: 12 mg/dL (ref 6–23)
CO2: 22 mmol/L (ref 19–32)
Calcium: 8.8 mg/dL (ref 8.4–10.5)
Chloride: 102 mmol/L (ref 96–112)
Creatinine, Ser: 0.65 mg/dL (ref 0.50–1.10)
GFR calc Af Amer: >90 mL/min (ref >90)
GFR calc non Af Amer: >90 mL/min (ref >90)
Glucose, Bld: 103 mg/dL — ABNORMAL HIGH (ref 70–99)
Potassium: 3.9 mmol/L (ref 3.5–5.1)
Sodium: 134 mmol/L — ABNORMAL LOW (ref 135–145)

## 2014-10-17 MED ORDER — DIPHENHYDRAMINE HCL 50 MG/ML IJ SOLN
25.0000 mg | Freq: Once | INTRAMUSCULAR | Status: DC
  Filled 2014-10-17: qty 1

## 2014-10-17 MED ORDER — METOCLOPRAMIDE HCL 5 MG/ML IJ SOLN
10.0000 mg | Freq: Once | INTRAMUSCULAR | Status: DC
  Filled 2014-10-17: qty 2

## 2014-10-17 MED ORDER — METOCLOPRAMIDE HCL 5 MG/ML IJ SOLN
10.0000 mg | Freq: Once | INTRAMUSCULAR | Status: AC
  Administered 2014-10-17: 10 mg via INTRAVENOUS
  Filled 2014-10-17: qty 2

## 2014-10-17 MED ORDER — SODIUM CHLORIDE 0.9 % IV BOLUS (SEPSIS)
1000.0000 mL | Freq: Once | INTRAVENOUS | Status: AC
  Administered 2014-10-17: 1000 mL via INTRAVENOUS

## 2014-10-17 MED ORDER — DIPHENHYDRAMINE HCL 50 MG/ML IJ SOLN
25.0000 mg | Freq: Once | INTRAMUSCULAR | Status: AC
  Administered 2014-10-17: 25 mg via INTRAVENOUS
  Filled 2014-10-17: qty 1

## 2014-10-17 MED ORDER — LORAZEPAM 2 MG/ML IJ SOLN
1.0000 mg | Freq: Once | INTRAMUSCULAR | Status: AC
  Administered 2014-10-17: 1 mg via INTRAVENOUS
  Filled 2014-10-17: qty 1

## 2014-10-17 MED ORDER — KETOROLAC TROMETHAMINE 30 MG/ML IJ SOLN
30.0000 mg | Freq: Once | INTRAMUSCULAR | Status: AC
  Administered 2014-10-17: 30 mg via INTRAVENOUS

## 2014-10-17 MED ORDER — KETOROLAC TROMETHAMINE 30 MG/ML IJ SOLN
INTRAMUSCULAR | Status: AC
  Administered 2014-10-17: 30 mg via INTRAVENOUS
  Filled 2014-10-17: qty 1

## 2014-10-17 NOTE — Discharge Instructions (Signed)

## 2014-10-17 NOTE — ED Provider Notes (Signed)
CSN: [id number]     Arrival date & time 10/17/14  1520 History   First MD Initiated Contact with Patient 10/17/14 1631     Chief Complaint  Patient presents with  . Headache     (Consider location/radiation/quality/duration/timing/severity/associated sxs/prior Treatment) Patient is a 44 y.o. female presenting with headaches. The history is provided by the patient. No language interpreter was used.  Headache Pain location:  Generalized Quality:  Sharp Radiates to:  Does not radiate Severity at highest:  6/10 Onset quality:  Gradual Duration:  1 day Timing:  Constant Progression:  Unchanged Chronicity:  New Similar to prior headaches: no   Context: not eating and not straining   Relieved by:  Nothing Worsened by:  Nothing Ineffective treatments:  NSAIDs Associated symptoms: no abdominal pain, no eye pain, no fever, no nausea, no near-syncope, no neck pain, no neck stiffness, no numbness and no paresthesias     Past Medical History  Diagnosis Date  . PVC's (premature ventricular contractions)   . Anxiety     Possible panic attacks - was on Celexa for three years  . History of cardiovascular stress test     Three to four years ago   Past Surgical History  Procedure Laterality Date  . Cesarean section      X 2  . Dilation and evacuation     Family History  Problem Relation Age of Onset  . Cancer Maternal Aunt   . Diabetes Paternal Uncle   . Stroke Paternal Aunt    History  Substance Use Topics  . Smoking status: Never Smoker   . Smokeless tobacco: Not on file  . Alcohol Use: No   OB History    No data available     Review of Systems  Constitutional: Negative for fever.  Eyes: Negative for pain.  Cardiovascular: Negative for near-syncope.  Gastrointestinal: Negative for nausea and abdominal pain.  Musculoskeletal: Negative for neck pain and neck stiffness.  Neurological: Positive for headaches. Negative for numbness and paresthesias.  All other systems  reviewed and are negative.     Allergies  Review of patient's allergies indicates no known allergies.  Home Medications   Prior to Admission medications   Medication Sig Start Date End Date Taking? Authorizing Provider  citalopram (CELEXA) 40 MG tablet Take 40 mg by mouth daily.   Yes Historical Provider, MD  desogestrel-ethinyl estradiol (APRI,EMOQUETTE,SOLIA) 0.15-30 MG-MCG tablet Take 1 tablet by mouth daily.   Yes Historical Provider, MD  spironolactone (ALDACTONE) 25 MG tablet Take 25 mg by mouth daily.   Yes Historical Provider, MD   BP 125/70 mmHg  Pulse 82  Temp(Src) 97.8 F (36.6 C) (Oral)  Resp 18  Ht  (1.651 m)  Wt 167 lb 6 oz (75.921 kg)  BMI 27.85 kg/m2  SpO2 97%  LMP 10/17/2014 (Exact Date) Physical Exam  Constitutional: She is oriented to person, place, and time. She appears well-developed and well-nourished.  HENT:  Head: Normocephalic.  Right Ear: External ear normal.  Left Ear: External ear normal.  Nose: Nose normal.  Mouth/Throat: Oropharynx is clear and moist.  Eyes: Conjunctivae and EOM are normal. Pupils are equal, round, and reactive to light.  Neck: Normal range of motion.  Cardiovascular: Normal rate, regular rhythm and normal heart sounds.   Pulmonary/Chest: Effort normal.  Abdominal: Soft. She exhibits no distension.  Musculoskeletal: Normal range of motion.  Neurological: She is alert and oriented to person, place, and time.  Skin: Skin is warm.  Psychiatric:  She has a normal mood and affect.  Nursing note and vitals reviewed.   ED Course  Procedures (including critical care time) Labs Review Labs Reviewed - No data to display  Imaging Review Ct Head Wo Contrast  10/17/2014   CLINICAL DATA:  Headache top of head with nausea since 3 a.m.  EXAM: CT HEAD WITHOUT CONTRAST  TECHNIQUE: Contiguous axial images were obtained from the base of the skull through the vertex without intravenous contrast.  COMPARISON:  None.  FINDINGS: No mass  lesion. No midline shift. No acute hemorrhage or hematoma. No extra-axial fluid collections. No evidence of acute infarction. Calvarium intact.  IMPRESSION: Negative head CT   Electronically Signed   By: Esperanza Heiraymond  Rubner M.D.   On: 10/17/2014 18:52     EKG Interpretation None      MDM  Pt given Iv fluids, torodol, reglan and benadryl.   Pt reports significant relief.   I doubt acute intercranial pathology, given normal ct and resolving symptoms.   Pt is advised to see her primary Md for recheck on Monday if headaches continue.   Return if any problems.   Final diagnoses:  Headache syndrome    AVS    Elson AreasLeslie K Lurie Mullane, PA-C 10/17/14 2155  Vanetta MuldersScott Zackowski, MD 10/18/14 2129

## 2014-10-17 NOTE — ED Notes (Signed)
Patient transported to CT 

## 2014-10-17 NOTE — ED Notes (Signed)
Pt c/o headache onset last night when she woke up. Pt reports nausea. No vomiting or change in vision.

## 2015-03-10 ENCOUNTER — Other Ambulatory Visit: Payer: Self-pay | Admitting: Obstetrics and Gynecology

## 2015-03-11 LAB — CYTOLOGY - PAP

## 2015-08-18 ENCOUNTER — Other Ambulatory Visit: Payer: Self-pay | Admitting: Orthopedic Surgery

## 2015-08-18 DIAGNOSIS — M79671 Pain in right foot: Secondary | ICD-10-CM

## 2015-08-23 ENCOUNTER — Ambulatory Visit
Admission: RE | Admit: 2015-08-23 | Discharge: 2015-08-23 | Disposition: A | Discharge status: Home / Self Care | Payer: 59 | Source: Ambulatory Visit | Attending: Orthopedic Surgery | Admitting: Orthopedic Surgery

## 2015-08-23 DIAGNOSIS — M79671 Pain in right foot: Secondary | ICD-10-CM

## 2015-11-08 ENCOUNTER — Other Ambulatory Visit: Payer: Self-pay | Admitting: Obstetrics and Gynecology

## 2015-11-08 DIAGNOSIS — R928 Other abnormal and inconclusive findings on diagnostic imaging of breast: Secondary | ICD-10-CM

## 2015-11-15 ENCOUNTER — Ambulatory Visit
Admission: RE | Admit: 2015-11-15 | Discharge: 2015-11-15 | Disposition: A | Discharge status: Home / Self Care | Payer: 59 | Source: Ambulatory Visit | Attending: Obstetrics and Gynecology | Admitting: Obstetrics and Gynecology

## 2015-11-15 DIAGNOSIS — R928 Other abnormal and inconclusive findings on diagnostic imaging of breast: Secondary | ICD-10-CM

## 2016-04-24 ENCOUNTER — Other Ambulatory Visit: Payer: Self-pay | Admitting: Obstetrics and Gynecology

## 2016-04-24 DIAGNOSIS — N6310 Unspecified lump in the right breast, unspecified quadrant: Secondary | ICD-10-CM

## 2016-05-18 ENCOUNTER — Ambulatory Visit
Admission: RE | Admit: 2016-05-18 | Discharge: 2016-05-18 | Disposition: A | Discharge status: Home / Self Care | Payer: 59 | Source: Ambulatory Visit | Attending: Obstetrics and Gynecology | Admitting: Obstetrics and Gynecology

## 2016-05-18 DIAGNOSIS — N6310 Unspecified lump in the right breast, unspecified quadrant: Secondary | ICD-10-CM

## 2017-01-22 ENCOUNTER — Other Ambulatory Visit: Payer: Self-pay | Admitting: Obstetrics and Gynecology

## 2017-01-22 DIAGNOSIS — N63 Unspecified lump in unspecified breast: Secondary | ICD-10-CM

## 2017-01-29 ENCOUNTER — Ambulatory Visit
Admission: RE | Admit: 2017-01-29 | Discharge: 2017-01-29 | Disposition: A | Discharge status: Home / Self Care | Payer: 59 | Source: Ambulatory Visit | Attending: Obstetrics and Gynecology | Admitting: Obstetrics and Gynecology

## 2017-01-29 DIAGNOSIS — N63 Unspecified lump in unspecified breast: Secondary | ICD-10-CM

## 2018-04-25 DIAGNOSIS — L700 Acne vulgaris: Secondary | ICD-10-CM | POA: Diagnosis not present

## 2018-04-25 DIAGNOSIS — Z79899 Other long term (current) drug therapy: Secondary | ICD-10-CM | POA: Diagnosis not present

## 2018-04-25 DIAGNOSIS — R61 Generalized hyperhidrosis: Secondary | ICD-10-CM | POA: Diagnosis not present

## 2018-05-02 DIAGNOSIS — Z01419 Encounter for gynecological examination (general) (routine) without abnormal findings: Secondary | ICD-10-CM | POA: Diagnosis not present

## 2018-05-02 DIAGNOSIS — Z6828 Body mass index (BMI) 28.0-28.9, adult: Secondary | ICD-10-CM | POA: Diagnosis not present

## 2018-06-02 IMAGING — MG 2D DIGITAL DIAGNOSTIC UNILATERAL RIGHT MAMMOGRAM WITH CAD AND AD
7 series · 8 of 11 positions shown · non-contrast
Comparison: Previous exams including diagnostic mammogram dated
11/15/2015 and screening mammogram dated 11/03/2015.

CLINICAL DATA: Six-month follow for probably benign summation
shadows within the upper inner quadrant of the right breast.

EXAM:
2D DIGITAL DIAGNOSTIC UNILATERAL RIGHT MAMMOGRAM WITH CAD AND
ADJUNCT TOMO

[R ML synth-2D]
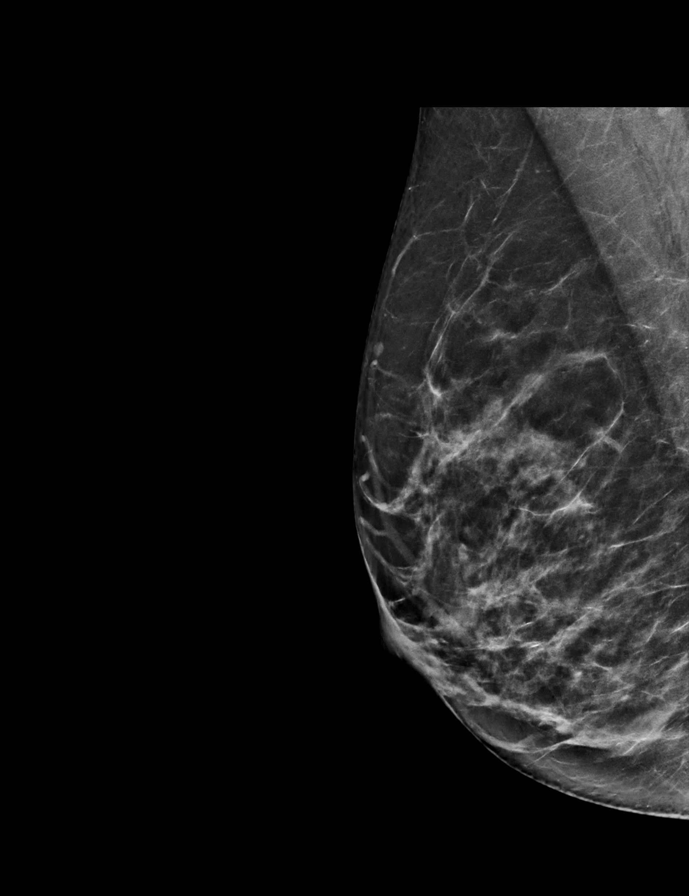

[R ML]
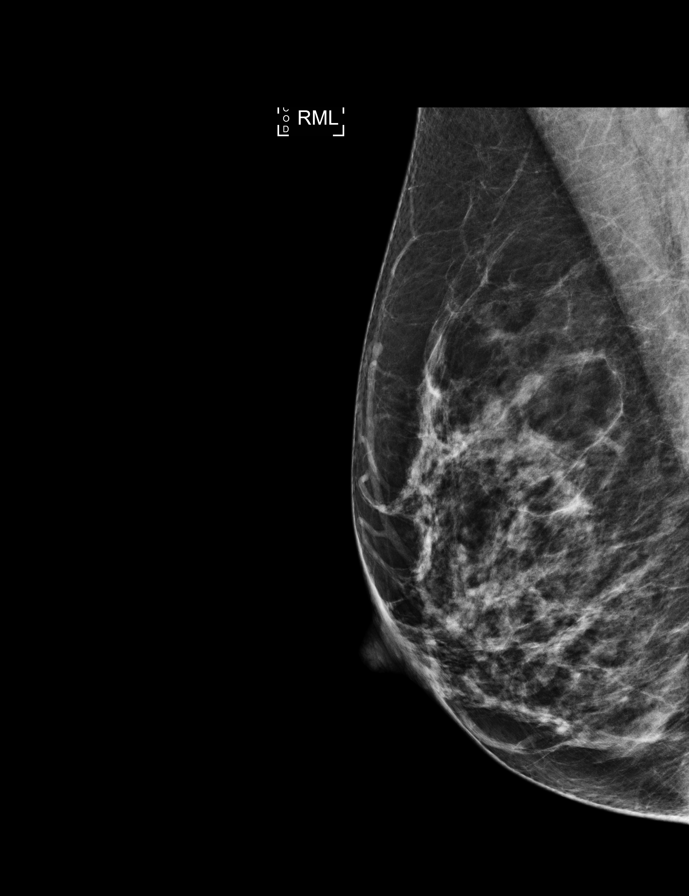

[R MLO]
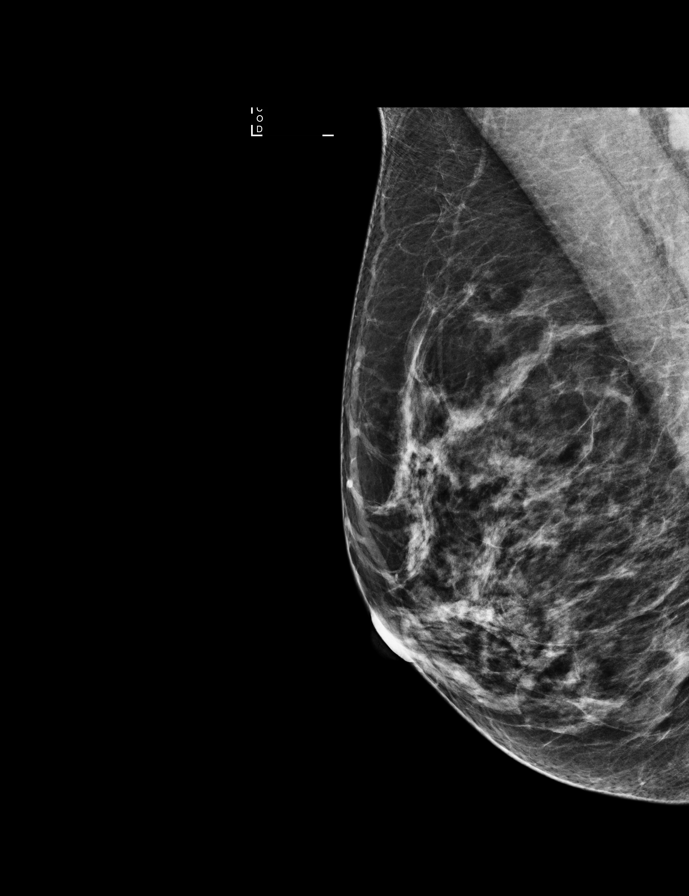

[R CC]
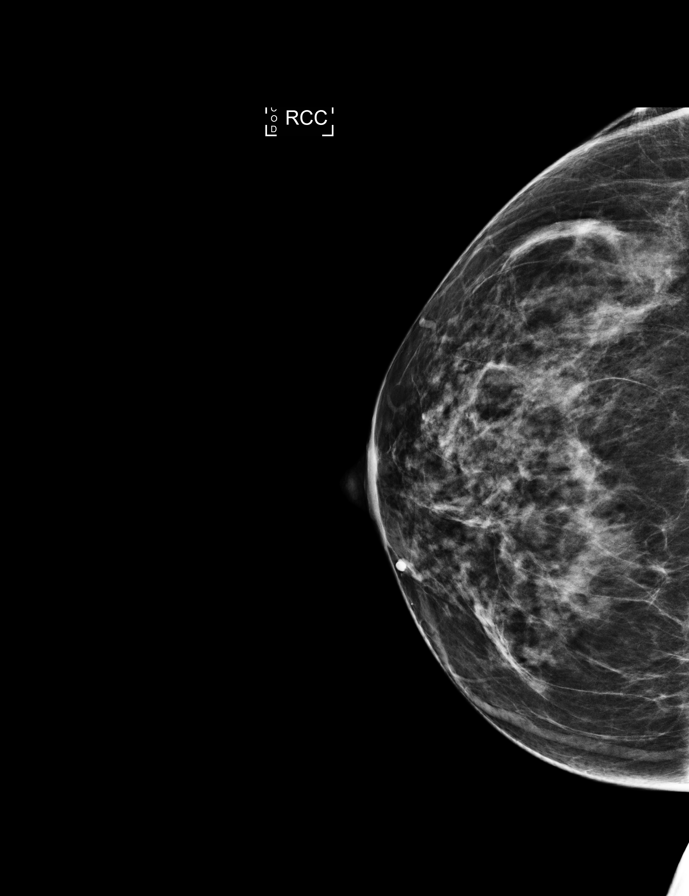

[R MLO synth-2D]
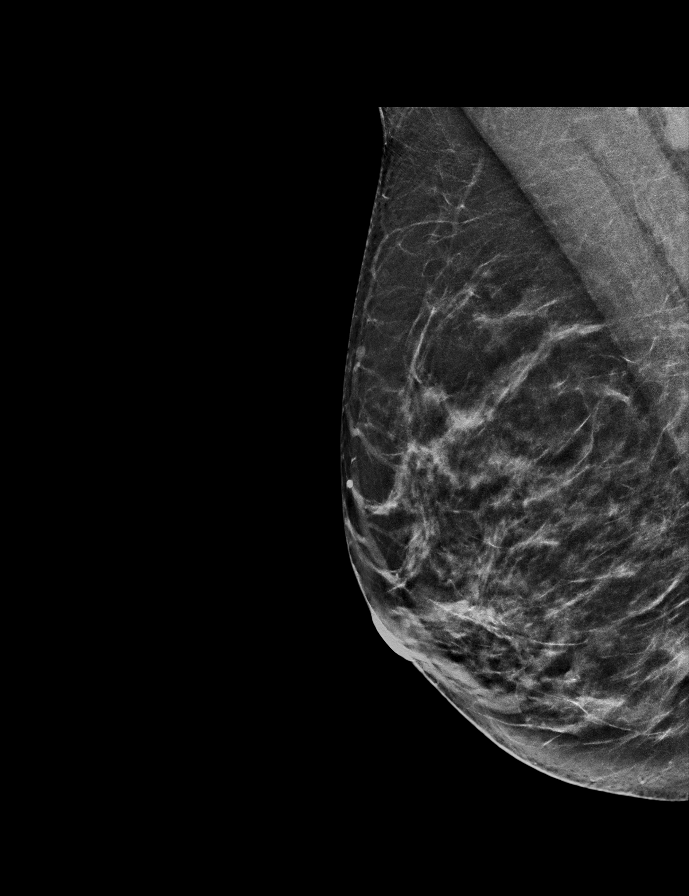

[R CC synth-2D]
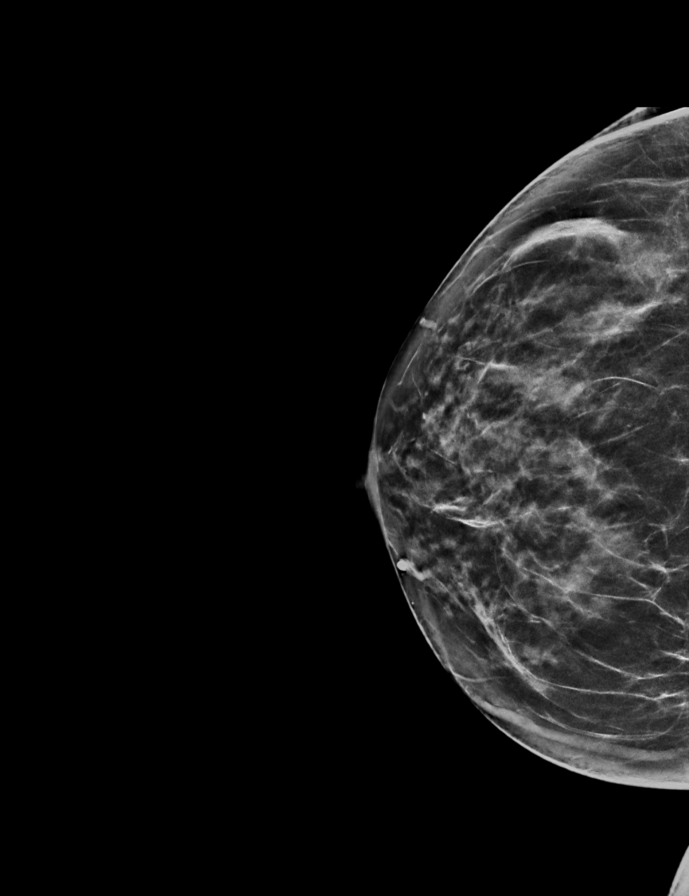

[R ML tomo · 2 of 66 frames shown]
[frame 22/66]
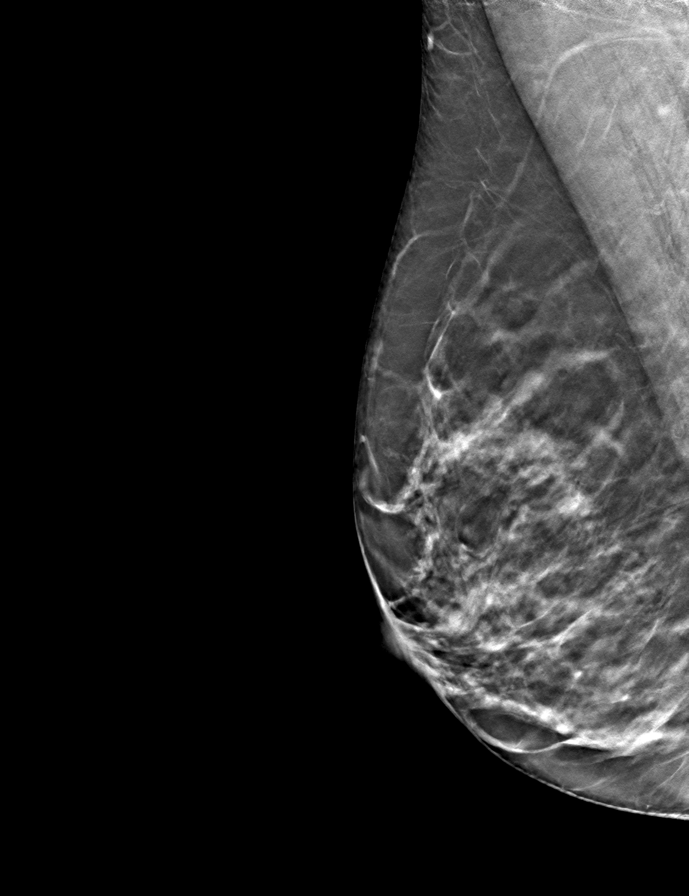
[frame 33/66]
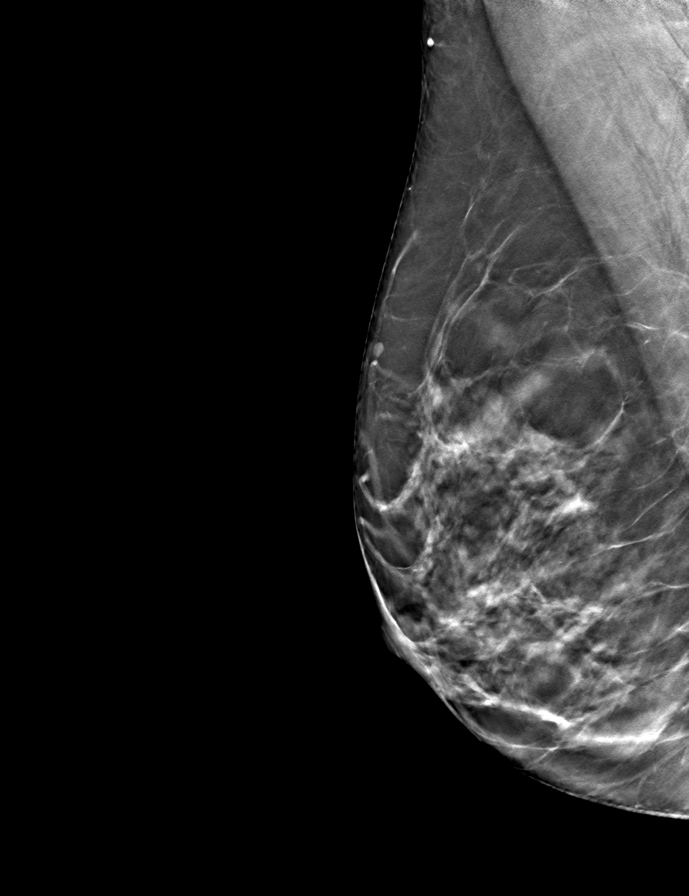

[8 of 11 positions shown; findings below may reference images not displayed]

ACR Breast Density Category c: The breast tissue is heterogeneously
dense, which may obscure small masses.
FINDINGS: Right breast 2D CC and MLO projections were obtained today, with
additional 3D tomosynthesis, and with additional 90 degree lateral.
The overall fibroglandular pattern of the right breast is stable.
Specifically, the fibroglandular pattern of the upper inner quadrant
is stable, again most suggestive of superimposition of normal
fibroglandular tissues. There are no new dominant masses, suspicious
calcifications or secondary signs of malignancy identified on
today's exam.

Mammographic images were processed with CAD.
IMPRESSION: Stable probably benign superimposition of normal fibroglandular
tissues within the upper inner quadrant of the right breast.

Recommend additional follow-up diagnostic mammogram in 6 months to
ensure continued stability. This will be performed as a bilateral
diagnostic mammogram in conjunction with patient's routine left
breast screening mammogram schedule.

RECOMMENDATION:
Bilateral diagnostic mammogram in 6 months.

I have discussed the findings and recommendations with the patient.
Results were also provided in writing at the conclusion of the
visit. If applicable, a reminder letter will be sent to the patient
regarding the next appointment.

BI-RADS CATEGORY  3: Probably benign.

## 2018-07-02 DIAGNOSIS — E669 Obesity, unspecified: Secondary | ICD-10-CM | POA: Diagnosis not present

## 2018-07-02 DIAGNOSIS — Z79899 Other long term (current) drug therapy: Secondary | ICD-10-CM | POA: Diagnosis not present

## 2018-07-02 DIAGNOSIS — R5383 Other fatigue: Secondary | ICD-10-CM | POA: Diagnosis not present

## 2018-07-02 DIAGNOSIS — Z0000 Encounter for general adult medical examination without abnormal findings: Secondary | ICD-10-CM | POA: Diagnosis not present

## 2022-08-28 ENCOUNTER — Other Ambulatory Visit: Payer: Self-pay | Admitting: Obstetrics and Gynecology

## 2022-08-28 DIAGNOSIS — R928 Other abnormal and inconclusive findings on diagnostic imaging of breast: Secondary | ICD-10-CM

## 2022-09-13 ENCOUNTER — Ambulatory Visit
Admission: RE | Admit: 2022-09-13 | Discharge: 2022-09-13 | Disposition: A | Discharge status: Home / Self Care | Payer: 59 | Source: Ambulatory Visit | Attending: Obstetrics and Gynecology | Admitting: Obstetrics and Gynecology

## 2022-09-13 ENCOUNTER — Ambulatory Visit: Payer: 59

## 2022-09-13 DIAGNOSIS — R928 Other abnormal and inconclusive findings on diagnostic imaging of breast: Secondary | ICD-10-CM
